# Patient Record
Sex: Female | Born: 2005 | Race: White | Hispanic: No | Marital: Single | Smoking: Never smoker
Health system: Southern US, Community
[De-identification: ages and names within clinical notes are randomized; demographics above are authoritative.]

---
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
## 2024-02-14 ENCOUNTER — Ambulatory Visit: Payer: Self-pay

## 2024-02-14 VITALS — BP 109/66 | HR 87 | Ht 62.0 in | Wt 95.1 lb

## 2024-02-14 DIAGNOSIS — Z1159 Encounter for screening for other viral diseases: Secondary | ICD-10-CM | POA: Diagnosis not present

## 2024-02-14 DIAGNOSIS — Z114 Encounter for screening for human immunodeficiency virus [HIV]: Secondary | ICD-10-CM | POA: Diagnosis not present

## 2024-02-14 DIAGNOSIS — R634 Abnormal weight loss: Secondary | ICD-10-CM

## 2024-02-14 DIAGNOSIS — Z681 Body mass index (BMI) 19 or less, adult: Secondary | ICD-10-CM

## 2024-02-14 NOTE — Progress Notes (Unsigned)
 New Patient Office Visit  Subjective    Patient ID: Jenna Hurst, female    DOB: 20-Jun-2005  Age: 18 y.o. MRN: [id number]  CC:  Chief Complaint  Patient presents with   Establish Care    Pt here to establish care also wanting to discuss gaining weight     HPI Jenna Hurst presents to establish care   Outpatient Encounter Medications as of 02/14/2024  Medication Sig   cetirizine  (ZYRTEC  ALLERGY) 10 MG tablet Take 1 tablet (10 mg total) by mouth 2 (two) times daily.   triamcinolone  cream (KENALOG ) 0.1 % Apply 1 Application topically 2 (two) times daily.   No facility-administered encounter medications on file as of 02/14/2024.    No past medical history on file.  No past surgical history on file.  Family History  Problem Relation Age of Onset   Diabetes Maternal Grandmother    Healthy Mother     Social History   Socioeconomic History   Marital status: Single    Spouse name: Not on file   Number of children: Not on file   Years of education: Not on file   Highest education level: 12th grade  Occupational History   Not on file  Tobacco Use   Smoking status: Never   Smokeless tobacco: Never  Vaping Use   Vaping status: Every Day   Substances: Nicotine  Substance and Sexual Activity   Alcohol use: No    Alcohol/week: 0.0 standard drinks of alcohol   Drug use: Yes    Types: Marijuana   Sexual activity: Yes    Comment: female partner  Other Topics Concern   Not on file  Social History Narrative   Not on file   Social Drivers of Health   Tobacco Use: Low Risk (02/14/2024)   Patient History    Smoking Tobacco Use: Never    Smokeless Tobacco Use: Never    Passive Exposure: Not on file  Financial Resource Strain: Low Risk (02/13/2024)   Overall Financial Resource Strain (CARDIA)    Difficulty of Paying Living Expenses: Not very hard  Food Insecurity: No Food Insecurity (02/13/2024)   Epic    Worried About Radiation Protection Practitioner of Food in the Last Year: Never  true    Ran Out of Food in the Last Year: Never true  Transportation Needs: No Transportation Needs (02/13/2024)   Epic    Lack of Transportation (Medical): No    Lack of Transportation (Non-Medical): No  Physical Activity: Unknown (02/13/2024)   Exercise Vital Sign    Days of Exercise per Week: 2 days    Minutes of Exercise per Session: Not on file  Stress: No Stress Concern Present (02/13/2024)   [employer] of Occupational Health - Occupational Stress Questionnaire    Feeling of Stress: Not at all  Social Connections: Socially Isolated (02/13/2024)   Social Connection and Isolation Panel    Frequency of Communication with Friends and Family: More than three times a week    Frequency of Social Gatherings with Friends and Family: Three times a week    Attends Religious Services: Never    Active Member of Clubs or Organizations: No    Attends Banker Meetings: Not on file    Marital Status: Never married  Intimate Partner Violence: Not At Risk (12/01/2022)   Humiliation, Afraid, Rape, and Kick questionnaire    Fear of Current or Ex-Partner: No    Emotionally Abused: No    Physically Abused: No  Sexually Abused: No  Depression (PHQ2-9): Low Risk (02/14/2024)   Depression (PHQ2-9)    PHQ-2 Score: 2  Alcohol Screen: Low Risk (12/01/2022)   Alcohol Screen    Last Alcohol Screening Score (AUDIT): 0  Housing: Low Risk (12/01/2022)   Housing    Last Housing Risk Score: 0  Utilities: Not At Risk (12/01/2022)   AHC Utilities    Threatened with loss of utilities: No  Health Literacy: Not on file   ROS     Objective    BP 109/66   Pulse 87   Ht 5' 2 (1.575 m)   Wt 95 lb 1.3 oz (43.1 kg)   SpO2 98%   BMI 17.39 kg/m   Physical Exam Vitals and nursing note reviewed.  Constitutional:      Appearance: Normal appearance. She is underweight.  HENT:     Head: Normocephalic.     Right Ear: Tympanic membrane, ear canal and external ear normal.     Left Ear:  Tympanic membrane, ear canal and external ear normal.     Nose: Nose normal.     Mouth/Throat:     Mouth: Mucous membranes are moist.     Pharynx: Oropharynx is clear.  Cardiovascular:     Rate and Rhythm: Normal rate and regular rhythm.  Pulmonary:     Effort: Pulmonary effort is normal.     Breath sounds: Normal breath sounds.  Musculoskeletal:     Cervical back: Normal range of motion and neck supple.  Skin:    General: Skin is warm and dry.  Neurological:     Mental Status: She is alert and oriented to person, place, and time.  Psychiatric:        Mood and Affect: Mood normal.        Thought Content: Thought content normal.     {Labs (Optional):23779}    Assessment & Plan:   Problem List Items Addressed This Visit   None   No follow-ups on file.   Leita Longs, FNP

## 2024-02-15 LAB — BASIC METABOLIC PANEL WITH GFR
BUN/Creatinine Ratio: 10 (ref 9–23)
BUN: 9 mg/dL (ref 6–20)
CO2: 20 mmol/L (ref 20–29)
Calcium: 9.7 mg/dL (ref 8.7–10.2)
Chloride: 103 mmol/L (ref 96–106)
Creatinine, Ser: 0.91 mg/dL (ref 0.57–1.00)
Glucose: 82 mg/dL (ref 70–99)
Potassium: 4.1 mmol/L (ref 3.5–5.2)
Sodium: 138 mmol/L (ref 134–144)
eGFR: 93 mL/min/1.73 (ref >59)

## 2024-02-15 LAB — CBC WITH DIFFERENTIAL/PLATELET
Basophils Absolute: 0.1 x10E3/uL (ref 0.0–0.2)
Basos: 1 %
EOS (ABSOLUTE): 0 x10E3/uL (ref 0.0–0.4)
Eos: 0 %
Hematocrit: 42.7 % (ref 34.0–46.6)
Hemoglobin: 14.1 g/dL (ref 11.1–15.9)
Immature Grans (Abs): 0 x10E3/uL (ref 0.0–0.1)
Immature Granulocytes: 0 %
Lymphocytes Absolute: 2.1 x10E3/uL (ref 0.7–3.1)
Lymphs: 24 %
MCH: 30.9 pg (ref 26.6–33.0)
MCHC: 33 g/dL (ref 31.5–35.7)
MCV: 94 fL (ref 79–97)
Monocytes Absolute: 0.6 x10E3/uL (ref 0.1–0.9)
Monocytes: 6 %
Neutrophils Absolute: 6.1 x10E3/uL (ref 1.4–7.0)
Neutrophils: 69 %
Platelets: 233 x10E3/uL (ref 150–450)
RBC: 4.56 x10E6/uL (ref 3.77–5.28)
RDW: 12.4 % (ref 11.7–15.4)
WBC: 8.9 x10E3/uL (ref 3.4–10.8)

## 2024-02-15 LAB — TSH+FREE T4
Free T4: 1.45 ng/dL (ref 0.93–1.60)
TSH: 0.545 u[IU]/mL (ref 0.450–4.500)

## 2024-02-15 LAB — HEPATITIS C ANTIBODY: Hep C Virus Ab: NONREACTIVE

## 2024-02-15 LAB — HEMOGLOBIN A1C
Est. average glucose Bld gHb Est-mCnc: 103 mg/dL
Hgb A1c MFr Bld: 5.2 % (ref 4.8–5.6)

## 2024-02-15 LAB — HIV ANTIBODY (ROUTINE TESTING W REFLEX): HIV Screen 4th Generation wRfx: NONREACTIVE

## 2024-02-16 ENCOUNTER — Ambulatory Visit: Payer: Self-pay

## 2024-02-16 DIAGNOSIS — R634 Abnormal weight loss: Secondary | ICD-10-CM | POA: Insufficient documentation

## 2024-02-16 DIAGNOSIS — Z681 Body mass index (BMI) 19 or less, adult: Secondary | ICD-10-CM | POA: Insufficient documentation

## 2024-02-16 NOTE — Assessment & Plan Note (Signed)
 Weight below 100 pounds. Suspected inadequate caloric intake. Ruled out pain, fever, shortness of breath, diarrhea, nausea, vomiting. Differential includes thyroid dysfunction, diabetes, nutritional deficiencies. - Ordered CBC. - Ordered thyroid function tests. - Ordered HIV screening. - Ordered hepatitis C screening. - Ordered metabolic panel. - Advised increasing protein intake and hydration.

## 2024-04-10 ENCOUNTER — Ambulatory Visit: Admitting: Adult Health

## 2024-05-14 ENCOUNTER — Ambulatory Visit: Payer: Self-pay
